# Patient Record
Sex: Female | Born: 1985 | Race: White | Hispanic: No | Marital: Married | Smoking: Never smoker
Health system: Southern US, Community
[De-identification: ages and names within clinical notes are randomized; demographics above are authoritative.]

## PROBLEM LIST (undated history)

## (undated) DIAGNOSIS — R87629 Unspecified abnormal cytological findings in specimens from vagina: Secondary | ICD-10-CM

## (undated) DIAGNOSIS — S52121A Displaced fracture of head of right radius, initial encounter for closed fracture: Secondary | ICD-10-CM

## (undated) DIAGNOSIS — A609 Anogenital herpesviral infection, unspecified: Secondary | ICD-10-CM

## (undated) DIAGNOSIS — Z789 Other specified health status: Secondary | ICD-10-CM

## (undated) HISTORY — DX: Anogenital herpesviral infection, unspecified: A60.9

## (undated) HISTORY — PX: COLPOSCOPY: SHX161

## (undated) HISTORY — DX: Unspecified abnormal cytological findings in specimens from vagina: R87.629

---

## 2014-11-16 ENCOUNTER — Emergency Department (HOSPITAL_COMMUNITY)
Admission: EM | Admit: 2014-11-16 | Discharge: 2014-11-17 | Disposition: A | Discharge status: Home / Self Care | Payer: No Typology Code available for payment source | Attending: Emergency Medicine | Admitting: Emergency Medicine

## 2014-11-16 ENCOUNTER — Encounter (HOSPITAL_COMMUNITY): Payer: Self-pay | Admitting: Emergency Medicine

## 2014-11-16 ENCOUNTER — Emergency Department (HOSPITAL_COMMUNITY): Payer: No Typology Code available for payment source

## 2014-11-16 DIAGNOSIS — S52121A Displaced fracture of head of right radius, initial encounter for closed fracture: Secondary | ICD-10-CM | POA: Diagnosis not present

## 2014-11-16 DIAGNOSIS — Y9389 Activity, other specified: Secondary | ICD-10-CM | POA: Diagnosis not present

## 2014-11-16 DIAGNOSIS — Y998 Other external cause status: Secondary | ICD-10-CM | POA: Diagnosis not present

## 2014-11-16 DIAGNOSIS — S8002XA Contusion of left knee, initial encounter: Secondary | ICD-10-CM | POA: Diagnosis not present

## 2014-11-16 DIAGNOSIS — S59901A Unspecified injury of right elbow, initial encounter: Secondary | ICD-10-CM | POA: Diagnosis present

## 2014-11-16 DIAGNOSIS — Y92410 Unspecified street and highway as the place of occurrence of the external cause: Secondary | ICD-10-CM | POA: Diagnosis not present

## 2014-11-16 DIAGNOSIS — Z880 Allergy status to penicillin: Secondary | ICD-10-CM | POA: Diagnosis not present

## 2014-11-16 DIAGNOSIS — M25531 Pain in right wrist: Secondary | ICD-10-CM

## 2014-11-16 DIAGNOSIS — S6991XA Unspecified injury of right wrist, hand and finger(s), initial encounter: Secondary | ICD-10-CM | POA: Insufficient documentation
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
## 2014-11-17 ENCOUNTER — Emergency Department (HOSPITAL_COMMUNITY): Payer: No Typology Code available for payment source

## 2014-11-17 DIAGNOSIS — S52121A Displaced fracture of head of right radius, initial encounter for closed fracture: Secondary | ICD-10-CM | POA: Diagnosis not present

## 2014-11-17 MED ORDER — OXYCODONE-ACETAMINOPHEN 5-325 MG PO TABS: 1.0000 | ORAL_TABLET | Freq: Four times a day (QID) | ORAL | Status: DC | PRN

## 2014-11-17 MED ORDER — OXYCODONE-ACETAMINOPHEN 5-325 MG PO TABS
1.0000 | ORAL_TABLET | Freq: Once | ORAL | Status: AC
  Administered 2014-11-17: 1 via ORAL
  Filled 2014-11-17: qty 1

## 2014-11-17 NOTE — Anesthesia Preprocedure Evaluation (Deleted)
Anesthesia Evaluation Anesthesia Physical Anesthesia Plan Anesthesia Quick Evaluation  

## 2014-11-17 NOTE — ED Notes (Signed)
Patient left at this time with all belongings. 
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
## 2014-11-18 ENCOUNTER — Ambulatory Visit (HOSPITAL_BASED_OUTPATIENT_CLINIC_OR_DEPARTMENT_OTHER): Payer: BC Managed Care – PPO | Admitting: Anesthesiology

## 2014-11-18 ENCOUNTER — Ambulatory Visit (HOSPITAL_BASED_OUTPATIENT_CLINIC_OR_DEPARTMENT_OTHER)
Admission: RE | Admit: 2014-11-18 | Discharge: 2014-11-18 | Disposition: A | Discharge status: Home / Self Care | Payer: BC Managed Care – PPO | Source: Ambulatory Visit | Attending: Orthopedic Surgery | Admitting: Orthopedic Surgery

## 2014-11-18 ENCOUNTER — Encounter (HOSPITAL_BASED_OUTPATIENT_CLINIC_OR_DEPARTMENT_OTHER)
Admission: RE | Disposition: A | Discharge status: Home / Self Care | Payer: Self-pay | Source: Ambulatory Visit | Attending: Orthopedic Surgery

## 2014-11-18 ENCOUNTER — Other Ambulatory Visit: Payer: Self-pay | Admitting: Physician Assistant

## 2014-11-18 ENCOUNTER — Encounter (HOSPITAL_BASED_OUTPATIENT_CLINIC_OR_DEPARTMENT_OTHER): Payer: Self-pay | Admitting: *Deleted

## 2014-11-18 DIAGNOSIS — S52121A Displaced fracture of head of right radius, initial encounter for closed fracture: Secondary | ICD-10-CM | POA: Diagnosis not present

## 2014-11-18 HISTORY — DX: Displaced fracture of head of right radius, initial encounter for closed fracture: S52.121A

## 2014-11-18 HISTORY — PX: ORIF RADIAL FRACTURE: SHX5113

## 2014-11-18 HISTORY — DX: Other specified health status: Z78.9

## 2014-11-18 SURGERY — OPEN REDUCTION INTERNAL FIXATION (ORIF) RADIAL FRACTURE
Anesthesia: Regional | Site: Elbow | Laterality: Right

## 2014-11-18 MED ORDER — GLYCOPYRROLATE 0.2 MG/ML IJ SOLN: 0.2000 mg | Freq: Once | INTRAMUSCULAR | Status: DC | PRN

## 2014-11-18 MED ORDER — CEFAZOLIN SODIUM-DEXTROSE 2-3 GM-% IV SOLR
2.0000 g | INTRAVENOUS | Status: AC
  Administered 2014-11-18: 2 g via INTRAVENOUS

## 2014-11-18 MED ORDER — ONDANSETRON HCL 4 MG PO TABS: 4.0000 mg | ORAL_TABLET | Freq: Three times a day (TID) | ORAL | Status: DC | PRN

## 2014-11-18 MED ORDER — 0.9 % SODIUM CHLORIDE (POUR BTL) OPTIME
TOPICAL | Status: DC | PRN
  Administered 2014-11-18: 200 mL

## 2014-11-18 MED ORDER — FENTANYL CITRATE (PF) 100 MCG/2ML IJ SOLN
INTRAMUSCULAR | Status: AC
  Filled 2014-11-18: qty 4

## 2014-11-18 MED ORDER — DEXAMETHASONE SODIUM PHOSPHATE 10 MG/ML IJ SOLN
INTRAMUSCULAR | Status: AC
  Filled 2014-11-18: qty 1

## 2014-11-18 MED ORDER — ONDANSETRON HCL 4 MG/2ML IJ SOLN
INTRAMUSCULAR | Status: AC
  Filled 2014-11-18: qty 2

## 2014-11-18 MED ORDER — BUPIVACAINE-EPINEPHRINE (PF) 0.5% -1:200000 IJ SOLN
INTRAMUSCULAR | Status: DC | PRN
  Administered 2014-11-18: 30 mL via PERINEURAL

## 2014-11-18 MED ORDER — PROMETHAZINE HCL 25 MG/ML IJ SOLN
6.2500 mg | INTRAMUSCULAR | Status: DC | PRN
Start: 2014-11-18 — End: 2014-11-18

## 2014-11-18 MED ORDER — FENTANYL CITRATE (PF) 100 MCG/2ML IJ SOLN
50.0000 ug | INTRAMUSCULAR | Status: AC | PRN
  Administered 2014-11-18: 25 ug via INTRAVENOUS
  Administered 2014-11-18: 100 ug via INTRAVENOUS
  Administered 2014-11-18: 25 ug via INTRAVENOUS

## 2014-11-18 MED ORDER — MIDAZOLAM HCL 2 MG/2ML IJ SOLN
INTRAMUSCULAR | Status: AC
Start: 2014-11-18 — End: 2014-11-18
  Filled 2014-11-18: qty 4

## 2014-11-18 MED ORDER — OXYCODONE-ACETAMINOPHEN 10-325 MG PO TABS: 1.0000 | ORAL_TABLET | Freq: Four times a day (QID) | ORAL | Status: DC | PRN

## 2014-11-18 MED ORDER — BACLOFEN 10 MG PO TABS: 10.0000 mg | ORAL_TABLET | Freq: Three times a day (TID) | ORAL | Status: DC

## 2014-11-18 MED ORDER — FENTANYL CITRATE (PF) 100 MCG/2ML IJ SOLN
INTRAMUSCULAR | Status: AC
  Filled 2014-11-18: qty 2

## 2014-11-18 MED ORDER — MEPERIDINE HCL 25 MG/ML IJ SOLN: 6.2500 mg | INTRAMUSCULAR | Status: DC | PRN

## 2014-11-18 MED ORDER — DEXAMETHASONE SODIUM PHOSPHATE 10 MG/ML IJ SOLN
INTRAMUSCULAR | Status: DC | PRN
  Administered 2014-11-18: 10 mg via INTRAVENOUS

## 2014-11-18 MED ORDER — SCOPOLAMINE 1 MG/3DAYS TD PT72: 1.0000 | MEDICATED_PATCH | Freq: Once | TRANSDERMAL | Status: DC | PRN

## 2014-11-18 MED ORDER — LACTATED RINGERS IV SOLN
INTRAVENOUS | Status: DC
  Administered 2014-11-18 (×2): via INTRAVENOUS

## 2014-11-18 MED ORDER — PROPOFOL 10 MG/ML IV BOLUS: INTRAVENOUS | Status: DC | PRN

## 2014-11-18 MED ORDER — HYDROMORPHONE HCL 1 MG/ML IJ SOLN: 0.2500 mg | INTRAMUSCULAR | Status: DC | PRN

## 2014-11-18 MED ORDER — MIDAZOLAM HCL 2 MG/2ML IJ SOLN
INTRAMUSCULAR | Status: AC
  Filled 2014-11-18: qty 2

## 2014-11-18 MED ORDER — ONDANSETRON HCL 4 MG/2ML IJ SOLN
INTRAMUSCULAR | Status: DC | PRN
  Administered 2014-11-18: 4 mg via INTRAVENOUS

## 2014-11-18 MED ORDER — LIDOCAINE HCL (CARDIAC) 20 MG/ML IV SOLN
INTRAVENOUS | Status: DC | PRN
  Administered 2014-11-18: 50 mg via INTRAVENOUS

## 2014-11-18 MED ORDER — MIDAZOLAM HCL 2 MG/2ML IJ SOLN
1.0000 mg | INTRAMUSCULAR | Status: DC | PRN
  Administered 2014-11-18 (×2): 2 mg via INTRAVENOUS

## 2014-11-18 MED ORDER — PROPOFOL 500 MG/50ML IV EMUL
INTRAVENOUS | Status: DC | PRN
  Administered 2014-11-18: 75 ug/kg/min via INTRAVENOUS

## 2014-11-18 MED ORDER — CEFAZOLIN SODIUM-DEXTROSE 2-3 GM-% IV SOLR
INTRAVENOUS | Status: AC
  Filled 2014-11-18: qty 50

## 2014-11-18 MED ORDER — SENNA-DOCUSATE SODIUM 8.6-50 MG PO TABS: 2.0000 | ORAL_TABLET | Freq: Every day | ORAL | Status: DC

## 2014-11-18 SURGICAL SUPPLY — 61 items
1.1 KWIRE ×3 IMPLANT
2.0 CANNULATED DRILL BIT ×3 IMPLANT
2.4 x 16mm Headless screw ×3 IMPLANT
BANDAGE ELASTIC 3 VELCRO ST LF (GAUZE/BANDAGES/DRESSINGS) IMPLANT
BANDAGE ELASTIC 4 VELCRO ST LF (GAUZE/BANDAGES/DRESSINGS) IMPLANT
BLADE MINI RND TIP GREEN BEAV (BLADE) IMPLANT
BLADE SURG 15 STRL LF DISP TIS (BLADE) ×2 IMPLANT
BLADE SURG 15 STRL SS (BLADE) ×4
BNDG COHESIVE 4X5 TAN STRL (GAUZE/BANDAGES/DRESSINGS) ×3 IMPLANT
BNDG ESMARK 4X9 LF (GAUZE/BANDAGES/DRESSINGS) ×3 IMPLANT
CLOSURE STERI-STRIP 1/2X4 (GAUZE/BANDAGES/DRESSINGS)
CLSR STERI-STRIP ANTIMIC 1/2X4 (GAUZE/BANDAGES/DRESSINGS) IMPLANT
COVER BACK TABLE 60X90IN (DRAPES) ×3 IMPLANT
CUFF TOURNIQUET SINGLE 18IN (TOURNIQUET CUFF) ×3 IMPLANT
DECANTER SPIKE VIAL GLASS SM (MISCELLANEOUS) IMPLANT
DRAPE EXTREMITY T 121X128X90 (DRAPE) ×3 IMPLANT
DRAPE INCISE IOBAN 66X45 STRL (DRAPES) IMPLANT
DRAPE OEC MINIVIEW 54X84 (DRAPES) ×3 IMPLANT
DRAPE SURG 17X23 STRL (DRAPES) ×3 IMPLANT
DRAPE U 20/CS (DRAPES) ×3 IMPLANT
DRSG TEGADERM 4X4.75 (GAUZE/BANDAGES/DRESSINGS) ×3 IMPLANT
DURAPREP 26ML APPLICATOR (WOUND CARE) ×3 IMPLANT
ELECT REM PT RETURN 9FT ADLT (ELECTROSURGICAL)
ELECTRODE REM PT RTRN 9FT ADLT (ELECTROSURGICAL) IMPLANT
GAUZE SPONGE 4X4 12PLY STRL (GAUZE/BANDAGES/DRESSINGS) IMPLANT
GLOVE BIO SURGEON STRL SZ8 (GLOVE) ×3 IMPLANT
GLOVE BIOGEL PI IND STRL 8 (GLOVE) ×2 IMPLANT
GLOVE BIOGEL PI INDICATOR 8 (GLOVE) ×4
GLOVE ORTHO TXT STRL SZ7.5 (GLOVE) ×3 IMPLANT
GOWN STRL REUS W/ TWL LRG LVL3 (GOWN DISPOSABLE) ×1 IMPLANT
GOWN STRL REUS W/ TWL XL LVL3 (GOWN DISPOSABLE) ×2 IMPLANT
GOWN STRL REUS W/TWL LRG LVL3 (GOWN DISPOSABLE) ×2
GOWN STRL REUS W/TWL XL LVL3 (GOWN DISPOSABLE) ×4
NEEDLE HYPO 25X1 1.5 SAFETY (NEEDLE) IMPLANT
NS IRRIG 1000ML POUR BTL (IV SOLUTION) ×3 IMPLANT
PACK BASIN DAY SURGERY FS (CUSTOM PROCEDURE TRAY) ×3 IMPLANT
PAD CAST 3X4 CTTN HI CHSV (CAST SUPPLIES) IMPLANT
PAD CAST 4YDX4 CTTN HI CHSV (CAST SUPPLIES) IMPLANT
PADDING CAST ABS 4INX4YD NS (CAST SUPPLIES)
PADDING CAST ABS COTTON 4X4 ST (CAST SUPPLIES) IMPLANT
PADDING CAST COTTON 3X4 STRL (CAST SUPPLIES)
PADDING CAST COTTON 4X4 STRL (CAST SUPPLIES)
PENCIL BUTTON HOLSTER BLD 10FT (ELECTRODE) IMPLANT
SLEEVE SCD COMPRESS KNEE MED (MISCELLANEOUS) ×3 IMPLANT
SPLINT PLASTER CAST XFAST 3X15 (CAST SUPPLIES) IMPLANT
SPLINT PLASTER XTRA FASTSET 3X (CAST SUPPLIES)
SPONGE GAUZE 4X4 12PLY STER LF (GAUZE/BANDAGES/DRESSINGS) ×3 IMPLANT
STOCKINETTE 4X48 STRL (DRAPES) IMPLANT
SUCTION FRAZIER TIP 10 FR DISP (SUCTIONS) IMPLANT
SUT ETHILON 3 0 PS 1 (SUTURE) IMPLANT
SUT ETHILON 4 0 PS 2 18 (SUTURE) IMPLANT
SUT MNCRL AB 4-0 PS2 18 (SUTURE) IMPLANT
SUT VIC AB 0 CT1 27 (SUTURE)
SUT VIC AB 0 CT1 27XBRD ANBCTR (SUTURE) IMPLANT
SUT VICRYL 3-0 CR8 SH (SUTURE) ×3 IMPLANT
SYR BULB 3OZ (MISCELLANEOUS) ×3 IMPLANT
SYR CONTROL 10ML LL (SYRINGE) IMPLANT
TOWEL OR 17X24 6PK STRL BLUE (TOWEL DISPOSABLE) ×6 IMPLANT
TUBE CONNECTING 20'X1/4 (TUBING) ×1
TUBE CONNECTING 20X1/4 (TUBING) ×2 IMPLANT
UNDERPAD 30X30 (UNDERPADS AND DIAPERS) ×3 IMPLANT

## 2014-11-18 NOTE — Anesthesia Preprocedure Evaluation (Addendum)
Anesthesia Evaluation  Patient identified by MRN, date of birth, ID band Patient awake    Reviewed: Allergy & Precautions, NPO status , Patient's Chart, lab work & pertinent test results  Airway Mallampati: II  TM Distance: >3 FB Neck ROM: Full    Dental no notable dental hx.    Pulmonary neg pulmonary ROS,    Pulmonary exam normal breath sounds clear to auscultation       Cardiovascular negative cardio ROS Normal cardiovascular exam Rhythm:Regular Rate:Normal     Neuro/Psych negative neurological ROS  negative psych ROS   GI/Hepatic negative GI ROS, Neg liver ROS,   Endo/Other  negative endocrine ROS  Renal/GU negative Renal ROS     Musculoskeletal negative musculoskeletal ROS (+)   Abdominal   Peds  Hematology negative hematology ROS (+)   Anesthesia Other Findings   Reproductive/Obstetrics negative OB ROS                             Anesthesia Physical Anesthesia Plan  ASA: I  Anesthesia Plan: Regional   Post-op Pain Management:    Induction: Intravenous  Airway Management Planned:   Additional Equipment:   Intra-op Plan:   Post-operative Plan:   Informed Consent: I have reviewed the patients History and Physical, chart, labs and discussed the procedure including the risks, benefits and alternatives for the proposed anesthesia with the patient or authorized representative who has indicated his/her understanding and acceptance.   Dental advisory given  Plan Discussed with: CRNA  Anesthesia Plan Comments:         Anesthesia Quick Evaluation  

## 2014-11-18 NOTE — Progress Notes (Signed)
Assisted Dr. Renold Don with right, ultrasound guided, axillary block. Side rails up, monitors on throughout procedure. See vital signs in flow sheet.

## 2014-11-18 NOTE — Anesthesia Procedure Notes (Addendum)
Procedure Name: MAC Date/Time: 11/18/2014 2:17 PM Performed by: Caren Macadam Pre-anesthesia Checklist: Patient identified, Timeout performed, Emergency Drugs available, Suction available and Patient being monitored Oxygen Delivery Method: Simple face mask Preoxygenation: Pre-oxygenation with 100% oxygen Intubation Type: IV induction Placement Confirmation: positive ETCO2   Anesthesia Regional Block:  Axillary brachial plexus block  Pre-Anesthetic Checklist: ,, timeout performed, Correct Patient, Correct Site, Correct Laterality, Correct Procedure, Correct Position, site marked, Risks and benefits discussed, Surgical consent,  Pre-op evaluation,  Post-op pain management  Laterality: Right  Prep: chloraprep       Needles:  Injection technique: Single-shot  Needle Type: Stimulator Needle - 40     Needle Length: 4cm 4 cm Needle Gauge: 22 and 22 G    Additional Needles:  Procedures: ultrasound guided (picture in chart) Axillary brachial plexus block Narrative:  Injection made incrementally with aspirations every 5 mL.  Performed by: Personally  Anesthesiologist: Lewie Loron  Additional Notes: BP cuff, EKG monitors applied. Sedation begun. Nerve location verified with U/S. Anesthetic injected incrementally, slowly , and after neg aspirations under direct u/s guidance. Good perineural spread. Tolerated well.

## 2014-11-18 NOTE — Anesthesia Postprocedure Evaluation (Signed)
Anesthesia Post Note  Patient: Betty Pratt  Procedure(s) Performed: Procedure(s) (LRB): OPEN REDUCTION INTERNAL FIXATION (ORIF) RIGHT RADIAL HEAD FRACTURE (Right)  Anesthesia type: MAC + ax block  Patient location: PACU  Post pain: Pain level controlled  Post assessment: Post-op Vital signs reviewed  Last Vitals: BP 114/75 mmHg  Pulse 69  Temp(Src) 36.8 C (Oral)  Resp 12  Ht  (1.676 m)  Wt 118 lb 2 oz (53.581 kg)  BMI 19.07 kg/m2  SpO2 98%  LMP 11/18/2014  Post vital signs: Reviewed  Level of consciousness: awake  Complications: No apparent anesthesia complications
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
## 2014-11-18 NOTE — Transfer of Care (Signed)
Immediate Anesthesia Transfer of Care Note  Patient: Betty Pratt  Procedure(s) Performed: Procedure(s): OPEN REDUCTION INTERNAL FIXATION (ORIF) RIGHT RADIAL HEAD FRACTURE (Right)  Patient Location: PACU  Anesthesia Type:MAC and MAC combined with regional for post-op pain  Level of Consciousness: awake, alert  and oriented  Airway & Oxygen Therapy: Patient Spontanous Breathing  Post-op Assessment: Report given to RN and Post -op Vital signs reviewed and stable  Post vital signs: Reviewed and stable  Last Vitals:  Filed Vitals:   11/18/14 1230  BP:   Pulse: 79  Temp:   Resp: 13    Complications: No apparent anesthesia complications

## 2014-11-18 NOTE — Discharge Instructions (Signed)
Diet: As you were doing prior to hospitalization   Shower:  May shower but keep the wounds dry, use an occlusive plastic wrap, NO SOAKING IN TUB.  If the bandage gets wet, change with a clean dry gauze.   Dressing:  You may change your dressing 3-5 days after surgery.  If you had hand or foot surgery, we will plan to remove your stitches in about 2 weeks in the office.  For all other surgeries, there are sticky tapes (steri-strips) on your wounds and all the stitches are absorbable.  Leave the steri-strips in place when changing your dressings, they will peel off with time, usually 2-3 weeks.  Activity:  Increase activity slowly as tolerated, but follow the weight bearing instructions below.  The rules on driving is that you can not be taking narcotics while you drive, and you must feel in control of the vehicle.    Weight Bearing:   No lifting with right arm..    To prevent constipation: you may use a stool softener such as -  Colace (over the counter) 100 mg by mouth twice a day  Drink plenty of fluids (prune juice may be helpful) and high fiber foods Miralax (over the counter) for constipation as needed.    Itching:  If you experience itching with your medications, try taking only a single pain pill, or even half a pain pill at a time.  You may take up to 10 pain pills per day, and you can also use benadryl over the counter for itching or also to help with sleep.   Precautions:  If you experience chest pain or shortness of breath - call 911 immediately for transfer to the hospital emergency department!!  If you develop a fever greater that 101 F, purulent drainage from wound, increased redness or drainage from wound, or calf pain -- Call the office at [phone]                                                Follow- Up Appointment:  Please call for an appointment to be seen in 2 weeks [place] - [phone]  Regional Anesthesia Blocks  1. Numbness or the inability to move the  "blocked" extremity may last from 3-48 hours after placement. The length of time depends on the medication injected and your individual response to the medication. If the numbness is not going away after 48 hours, call your surgeon.  2. The extremity that is blocked will need to be protected until the numbness is gone and the  Strength has returned. Because you cannot feel it, you will need to take extra care to avoid injury. Because it may be weak, you may have difficulty moving it or using it. You may not know what position it is in without looking at it while the block is in effect.  3. For blocks in the legs and feet, returning to weight bearing and walking needs to be done carefully. You will need to wait until the numbness is entirely gone and the strength has returned. You should be able to move your leg and foot normally before you try and bear weight or walk. You will need someone to be with you when you first try to ensure you do not fall and possibly risk injury.  4. Bruising and tenderness at the needle site are common side  effects and will resolve in a few days.  5. Persistent numbness or new problems with movement should be communicated to the surgeon or the [facility] [phone] [facility] [phone]).  Post Anesthesia Home Care Instructions  Activity: Get plenty of rest for the remainder of the day. A responsible adult should stay with you for 24 hours following the procedure.  For the next 24 hours, DO NOT: -Drive a car -Advertising copywriter -Drink alcoholic beverages -Take any medication unless instructed by your physician -Make any legal decisions or sign important papers.  Meals: Start with liquid foods such as gelatin or soup. Progress to regular foods as tolerated. Avoid greasy, spicy, heavy foods. If nausea and/or vomiting occur, drink only clear liquids until the nausea and/or vomiting subsides. Call your physician if vomiting  continues.  Special Instructions/Symptoms: Your throat may feel dry or sore from the anesthesia or the breathing tube placed in your throat during surgery. If this causes discomfort, gargle with warm salt water. The discomfort should disappear within 24 hours.  If you had a scopolamine patch placed behind your ear for the management of post- operative nausea and/or vomiting:  1. The medication in the patch is effective for 72 hours, after which it should be removed.  Wrap patch in a tissue and discard in the trash. Wash hands thoroughly with soap and water. 2. You may remove the patch earlier than 72 hours if you experience unpleasant side effects which may include dry mouth, dizziness or visual disturbances. 3. Avoid touching the patch. Wash your hands with soap and water after contact with the patch.

## 2014-11-19 ENCOUNTER — Encounter (HOSPITAL_BASED_OUTPATIENT_CLINIC_OR_DEPARTMENT_OTHER): Payer: Self-pay | Admitting: Orthopedic Surgery

## 2015-11-21 LAB — OB RESULTS CONSOLE RUBELLA ANTIBODY, IGM: Rubella: IMMUNE

## 2015-11-21 LAB — OB RESULTS CONSOLE GC/CHLAMYDIA
CHLAMYDIA, DNA PROBE: NEGATIVE
GC PROBE AMP, GENITAL: NEGATIVE

## 2015-11-21 LAB — OB RESULTS CONSOLE ABO/RH: RH TYPE: POSITIVE

## 2015-11-21 LAB — OB RESULTS CONSOLE HIV ANTIBODY (ROUTINE TESTING): HIV: NONREACTIVE

## 2015-11-21 LAB — OB RESULTS CONSOLE RPR: RPR: NONREACTIVE

## 2015-11-21 LAB — OB RESULTS CONSOLE HEPATITIS B SURFACE ANTIGEN: Hepatitis B Surface Ag: NEGATIVE

## 2016-02-20 NOTE — L&D Delivery Note (Signed)
Delivery Note At 1:16 PM a viable female was delivered via Vaginal, Spontaneous Delivery (Presentation:OA ;  ).  APGAR: , ; weight  .   Placenta statusspont>>intact: , .  Cord:  with the following complications: .  Cord pH: not sent  Anesthesia:  epid Episiotomy: None Lacerations: 2nd degree + bilat periurethral Suture Repair: 3.0 vicryl rapide Est. Blood Loss (mL): 300  Mom to postpartum.  Baby to Couplet care / Skin to Skin.  Meriel Pica 06/16/2016, 1:41 PM
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
## 2016-05-21 LAB — OB RESULTS CONSOLE GC/CHLAMYDIA
Chlamydia: NEGATIVE
GC PROBE AMP, GENITAL: NEGATIVE

## 2016-05-21 LAB — OB RESULTS CONSOLE GBS: GBS: NEGATIVE

## 2016-06-16 ENCOUNTER — Encounter (HOSPITAL_COMMUNITY): Payer: Self-pay

## 2016-06-16 ENCOUNTER — Inpatient Hospital Stay (HOSPITAL_COMMUNITY)
Admission: AD | Admit: 2016-06-16 | Discharge: 2016-06-17 | DRG: 775 | Disposition: A | Discharge status: Home / Self Care | Payer: BLUE CROSS/BLUE SHIELD | Source: Ambulatory Visit | Attending: Obstetrics and Gynecology | Admitting: Obstetrics and Gynecology

## 2016-06-16 ENCOUNTER — Inpatient Hospital Stay (HOSPITAL_COMMUNITY): Payer: BLUE CROSS/BLUE SHIELD | Admitting: Anesthesiology

## 2016-06-16 DIAGNOSIS — Z3A37 37 weeks gestation of pregnancy: Secondary | ICD-10-CM

## 2016-06-16 DIAGNOSIS — Z3493 Encounter for supervision of normal pregnancy, unspecified, third trimester: Secondary | ICD-10-CM | POA: Diagnosis present

## 2016-06-16 LAB — CBC
HCT: 35.1 % — ABNORMAL LOW (ref 36.0–46.0)
HEMOGLOBIN: 11.6 g/dL — AB (ref 12.0–15.0)
MCH: 26.4 pg (ref 26.0–34.0)
MCHC: 33 g/dL (ref 30.0–36.0)
MCV: 80 fL (ref 78.0–100.0)
PLATELETS: 239 10*3/uL (ref 150–400)
RBC: 4.39 MIL/uL (ref 3.87–5.11)
RDW: 13.7 % (ref 11.5–15.5)
WBC: 13.7 10*3/uL — AB (ref 4.0–10.5)

## 2016-06-16 LAB — ABO/RH: ABO/RH(D): A POS

## 2016-06-16 LAB — TYPE AND SCREEN
ABO/RH(D): A POS
Antibody Screen: NEGATIVE

## 2016-06-16 MED ORDER — LACTATED RINGERS IV SOLN: 500.0000 mL | INTRAVENOUS | Status: DC | PRN

## 2016-06-16 MED ORDER — DIPHENHYDRAMINE HCL 50 MG/ML IJ SOLN: 12.5000 mg | INTRAMUSCULAR | Status: DC | PRN

## 2016-06-16 MED ORDER — COCONUT OIL OIL: 1.0000 "application " | TOPICAL_OIL | Status: DC | PRN

## 2016-06-16 MED ORDER — FLEET ENEMA 7-19 GM/118ML RE ENEM: 1.0000 | ENEMA | Freq: Every day | RECTAL | Status: DC | PRN

## 2016-06-16 MED ORDER — WITCH HAZEL-GLYCERIN EX PADS: 1.0000 "application " | MEDICATED_PAD | CUTANEOUS | Status: DC | PRN

## 2016-06-16 MED ORDER — OXYCODONE-ACETAMINOPHEN 5-325 MG PO TABS: 2.0000 | ORAL_TABLET | ORAL | Status: DC | PRN

## 2016-06-16 MED ORDER — SIMETHICONE 80 MG PO CHEW: 80.0000 mg | CHEWABLE_TABLET | ORAL | Status: DC | PRN

## 2016-06-16 MED ORDER — LACTATED RINGERS IV SOLN
INTRAVENOUS | Status: DC
  Administered 2016-06-16: 09:00:00 via INTRAVENOUS

## 2016-06-16 MED ORDER — IBUPROFEN 800 MG PO TABS
800.0000 mg | ORAL_TABLET | Freq: Three times a day (TID) | ORAL | Status: DC | PRN
  Administered 2016-06-16 – 2016-06-17 (×4): 800 mg via ORAL
  Filled 2016-06-16 (×4): qty 1

## 2016-06-16 MED ORDER — BISACODYL 10 MG RE SUPP: 10.0000 mg | Freq: Every day | RECTAL | Status: DC | PRN

## 2016-06-16 MED ORDER — ONDANSETRON HCL 4 MG/2ML IJ SOLN: 4.0000 mg | Freq: Four times a day (QID) | INTRAMUSCULAR | Status: DC | PRN

## 2016-06-16 MED ORDER — SENNOSIDES-DOCUSATE SODIUM 8.6-50 MG PO TABS
2.0000 | ORAL_TABLET | ORAL | Status: DC
  Administered 2016-06-16: 2 via ORAL
  Filled 2016-06-16: qty 2

## 2016-06-16 MED ORDER — EPHEDRINE 5 MG/ML INJ
10.0000 mg | INTRAVENOUS | Status: DC | PRN
  Filled 2016-06-16: qty 2

## 2016-06-16 MED ORDER — ZOLPIDEM TARTRATE 5 MG PO TABS: 5.0000 mg | ORAL_TABLET | Freq: Every evening | ORAL | Status: DC | PRN

## 2016-06-16 MED ORDER — OXYTOCIN 40 UNITS IN LACTATED RINGERS INFUSION - SIMPLE MED
2.5000 [IU]/h | INTRAVENOUS | Status: DC
  Filled 2016-06-16: qty 1000

## 2016-06-16 MED ORDER — PHENYLEPHRINE 40 MCG/ML (10ML) SYRINGE FOR IV PUSH (FOR BLOOD PRESSURE SUPPORT)
80.0000 ug | PREFILLED_SYRINGE | INTRAVENOUS | Status: DC | PRN
  Filled 2016-06-16: qty 5

## 2016-06-16 MED ORDER — FENTANYL 2.5 MCG/ML BUPIVACAINE 1/10 % EPIDURAL INFUSION (WH - ANES)
14.0000 mL/h | INTRAMUSCULAR | Status: DC | PRN
  Administered 2016-06-16: 14 mL/h via EPIDURAL
  Filled 2016-06-16: qty 100

## 2016-06-16 MED ORDER — OXYCODONE-ACETAMINOPHEN 5-325 MG PO TABS
1.0000 | ORAL_TABLET | ORAL | Status: DC | PRN
  Administered 2016-06-17: 1 via ORAL
  Filled 2016-06-16: qty 1

## 2016-06-16 MED ORDER — LACTATED RINGERS IV SOLN: 500.0000 mL | Freq: Once | INTRAVENOUS | Status: DC

## 2016-06-16 MED ORDER — ACETAMINOPHEN 325 MG PO TABS: 650.0000 mg | ORAL_TABLET | ORAL | Status: DC | PRN

## 2016-06-16 MED ORDER — PHENYLEPHRINE 40 MCG/ML (10ML) SYRINGE FOR IV PUSH (FOR BLOOD PRESSURE SUPPORT)
80.0000 ug | PREFILLED_SYRINGE | INTRAVENOUS | Status: DC | PRN
  Filled 2016-06-16: qty 10
  Filled 2016-06-16: qty 5

## 2016-06-16 MED ORDER — ONDANSETRON HCL 4 MG PO TABS: 4.0000 mg | ORAL_TABLET | ORAL | Status: DC | PRN

## 2016-06-16 MED ORDER — BUTORPHANOL TARTRATE 1 MG/ML IJ SOLN: 1.0000 mg | INTRAMUSCULAR | Status: DC | PRN

## 2016-06-16 MED ORDER — MEASLES, MUMPS & RUBELLA VAC ~~LOC~~ INJ: 0.5000 mL | INJECTION | Freq: Once | SUBCUTANEOUS | Status: DC

## 2016-06-16 MED ORDER — ONDANSETRON HCL 4 MG/2ML IJ SOLN: 4.0000 mg | INTRAMUSCULAR | Status: DC | PRN

## 2016-06-16 MED ORDER — BUTORPHANOL TARTRATE 1 MG/ML IJ SOLN
1.0000 mg | INTRAMUSCULAR | Status: DC
  Administered 2016-06-16: 1 mg via INTRAVENOUS
  Filled 2016-06-16: qty 1

## 2016-06-16 MED ORDER — TERBUTALINE SULFATE 1 MG/ML IJ SOLN
0.2500 mg | Freq: Once | INTRAMUSCULAR | Status: DC | PRN
  Filled 2016-06-16: qty 1

## 2016-06-16 MED ORDER — OXYTOCIN BOLUS FROM INFUSION
500.0000 mL | Freq: Once | INTRAVENOUS | Status: AC
  Administered 2016-06-16: 500 mL via INTRAVENOUS

## 2016-06-16 MED ORDER — DIBUCAINE 1 % RE OINT: 1.0000 "application " | TOPICAL_OINTMENT | RECTAL | Status: DC | PRN

## 2016-06-16 MED ORDER — OXYCODONE-ACETAMINOPHEN 5-325 MG PO TABS: 1.0000 | ORAL_TABLET | ORAL | Status: DC | PRN

## 2016-06-16 MED ORDER — TETANUS-DIPHTH-ACELL PERTUSSIS 5-2.5-18.5 LF-MCG/0.5 IM SUSP: 0.5000 mL | Freq: Once | INTRAMUSCULAR | Status: DC

## 2016-06-16 MED ORDER — DIPHENHYDRAMINE HCL 25 MG PO CAPS: 25.0000 mg | ORAL_CAPSULE | Freq: Four times a day (QID) | ORAL | Status: DC | PRN

## 2016-06-16 MED ORDER — BENZOCAINE-MENTHOL 20-0.5 % EX AERO
1.0000 "application " | INHALATION_SPRAY | CUTANEOUS | Status: DC | PRN
  Administered 2016-06-16: 1 via TOPICAL
  Filled 2016-06-16: qty 56

## 2016-06-16 MED ORDER — SOD CITRATE-CITRIC ACID 500-334 MG/5ML PO SOLN: 30.0000 mL | ORAL | Status: DC | PRN

## 2016-06-16 MED ORDER — OXYTOCIN 40 UNITS IN LACTATED RINGERS INFUSION - SIMPLE MED
1.0000 m[IU]/min | INTRAVENOUS | Status: DC
  Administered 2016-06-16: 2 m[IU]/min via INTRAVENOUS

## 2016-06-16 MED ORDER — LIDOCAINE HCL (PF) 1 % IJ SOLN
30.0000 mL | INTRAMUSCULAR | Status: DC | PRN
  Administered 2016-06-16: 30 mL via SUBCUTANEOUS
  Filled 2016-06-16: qty 30

## 2016-06-16 MED ORDER — PRENATAL MULTIVITAMIN CH
1.0000 | ORAL_TABLET | Freq: Every day | ORAL | Status: DC
  Administered 2016-06-17: 1 via ORAL
  Filled 2016-06-16: qty 1

## 2016-06-16 MED ORDER — ACETAMINOPHEN 325 MG PO TABS
650.0000 mg | ORAL_TABLET | ORAL | Status: DC | PRN
  Administered 2016-06-16 – 2016-06-17 (×2): 650 mg via ORAL
  Filled 2016-06-16: qty 2

## 2016-06-16 MED ORDER — LIDOCAINE HCL (PF) 1 % IJ SOLN
INTRAMUSCULAR | Status: DC | PRN
  Administered 2016-06-16: 4 mL
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
## 2016-06-16 NOTE — Anesthesia Preprocedure Evaluation (Signed)
Anesthesia Evaluation  Patient identified by MRN, date of birth, ID band Patient awake    Reviewed: Allergy & Precautions, Patient's Chart, lab work & pertinent test results  Airway Mallampati: I  TM Distance: >3 FB Neck ROM: Full    Dental  (+) Teeth Intact, Dental Advisory Given   Pulmonary neg pulmonary ROS,    breath sounds clear to auscultation       Cardiovascular negative cardio ROS   Rhythm:Regular Rate:Normal     Neuro/Psych negative neurological ROS  negative psych ROS   GI/Hepatic negative GI ROS, Neg liver ROS,   Endo/Other  negative endocrine ROS  Renal/GU negative Renal ROS  negative genitourinary   Musculoskeletal negative musculoskeletal ROS (+)   Abdominal   Peds negative pediatric ROS (+)  Hematology negative hematology ROS (+)   Anesthesia Other Findings   Reproductive/Obstetrics (+) Pregnancy                             Lab Results  Component Value Date   WBC 13.7 (H) 06/16/2016   HGB 11.6 (L) 06/16/2016   HCT 35.1 (L) 06/16/2016   MCV 80.0 06/16/2016   PLT 239 06/16/2016     Anesthesia Physical Anesthesia Plan  ASA: II  Anesthesia Plan: Epidural   Post-op Pain Management:    Induction:   Airway Management Planned:   Additional Equipment:   Intra-op Plan:   Post-operative Plan:   Informed Consent: I have reviewed the patients History and Physical, chart, labs and discussed the procedure including the risks, benefits and alternatives for the proposed anesthesia with the patient or authorized representative who has indicated his/her understanding and acceptance.     Plan Discussed with:   Anesthesia Plan Comments:         Anesthesia Quick Evaluation

## 2016-06-16 NOTE — Anesthesia Procedure Notes (Signed)
Epidural Patient location during procedure: OB Start time: 06/16/2016 10:13 AM End time: 06/16/2016 10:19 AM  Staffing Anesthesiologist: Shona Simpson D Performed: anesthesiologist   Preanesthetic Checklist Completed: patient identified, site marked, surgical consent, pre-op evaluation, timeout performed, IV checked, risks and benefits discussed and monitors and equipment checked  Epidural Patient position: sitting Prep: ChloraPrep Patient monitoring: heart rate, continuous pulse ox and blood pressure Approach: midline Location: L3-L4 Injection technique: LOR saline  Needle:  Needle type: Tuohy  Needle gauge: 17 G Needle length: 9 cm Catheter type: closed end flexible Catheter size: 20 Guage Test dose: negative and 1.5% lidocaine  Assessment Events: blood not aspirated, injection not painful, no injection resistance and no paresthesia  Additional Notes LOR @ 5  Patient identified. Risks/Benefits/Options discussed with patient including but not limited to bleeding, infection, nerve damage, paralysis, failed block, incomplete pain control, headache, blood pressure changes, nausea, vomiting, reactions to medications, itching and postpartum back pain. Confirmed with bedside nurse the patient's most recent platelet count. Confirmed with patient that they are not currently taking any anticoagulation, have any bleeding history or any family history of bleeding disorders. Patient expressed understanding and wished to proceed. All questions were answered. Sterile technique was used throughout the entire procedure. Please see nursing notes for vital signs. Test dose was given through epidural catheter and negative prior to continuing to dose epidural or start infusion. Warning signs of high block given to the patient including shortness of breath, tingling/numbness in hands, complete motor block, or any concerning symptoms with instructions to call for help. Patient was given instructions on  fall risk and not to get out of bed. All questions and concerns addressed with instructions to call with any issues or inadequate analgesia.    Reason for block:procedure for pain

## 2016-06-16 NOTE — Anesthesia Pain Management Evaluation Note (Signed)
  CRNA Pain Management Visit Note  Patient: Betty Pratt, 31 y.o., female  "Hello I am a member of the anesthesia team at [facility]. We have an anesthesia team available at all times to provide care throughout the hospital, including epidural management and anesthesia for C-section. I don't know your plan for the delivery whether it a natural birth, water birth, IV sedation, nitrous supplementation, doula or epidural, but we want to meet your pain goals."   1.Was your pain managed to your expectations on prior hospitalizations?   No prior hospitalizations  2.What is your expectation for pain management during this hospitalization?     Epidural  3.How can we help you reach that goal? Epidural recently placed.  Record the patient's initial score and the patient's pain goal.   Pain: 8 prior to epidural.  Per patient, pain is gradually decreasing.  Pain Goal: 5 [facility] wants you to be able to say your pain was always managed very well.  Caliyah Sieh L 06/16/2016

## 2016-06-16 NOTE — MAU Note (Signed)
Urine in lab 

## 2016-06-16 NOTE — Progress Notes (Signed)
Dr Marcelle Overlie notified of pt's VE, FHR pattern, contraction pattern, orders received to admit pt

## 2016-06-16 NOTE — MAU Note (Signed)
Pt presents to MAU with complaints of contractions with bloody show that started around 1am.

## 2016-06-17 LAB — CBC
HCT: 29.5 % — ABNORMAL LOW (ref 36.0–46.0)
Hemoglobin: 9.9 g/dL — ABNORMAL LOW (ref 12.0–15.0)
MCH: 27.4 pg (ref 26.0–34.0)
MCHC: 33.6 g/dL (ref 30.0–36.0)
MCV: 81.7 fL (ref 78.0–100.0)
PLATELETS: 198 10*3/uL (ref 150–400)
RBC: 3.61 MIL/uL — AB (ref 3.87–5.11)
RDW: 13.9 % (ref 11.5–15.5)
WBC: 10.7 10*3/uL — AB (ref 4.0–10.5)

## 2016-06-17 LAB — RPR: RPR Ser Ql: NONREACTIVE

## 2016-06-17 MED ORDER — IBUPROFEN 800 MG PO TABS: 800.0000 mg | ORAL_TABLET | Freq: Three times a day (TID) | ORAL | 1 refills | Status: AC | PRN

## 2016-06-17 MED ORDER — OXYCODONE-ACETAMINOPHEN 5-325 MG PO TABS: 1.0000 | ORAL_TABLET | Freq: Four times a day (QID) | ORAL | 0 refills | Status: DC | PRN

## 2016-06-17 NOTE — Anesthesia Postprocedure Evaluation (Signed)
Anesthesia Post Note  Patient: Betty Pratt  Procedure(s) Performed: * No procedures listed *  Patient location during evaluation: Mother Baby Anesthesia Type: Epidural Level of consciousness: awake Pain management: satisfactory to patient Vital Signs Assessment: post-procedure vital signs reviewed and stable Respiratory status: spontaneous breathing Cardiovascular status: stable Anesthetic complications: no        Last Vitals:  Vitals:   06/16/16 2257 06/17/16 0500  BP: 108/60 117/80  Pulse: 67 68  Resp: 18 18  Temp: 36.7 C 36.7 C    Last Pain:  Vitals:   06/17/16 0900  TempSrc:   PainSc: 3    Pain Goal: Patients Stated Pain Goal: 4 (06/16/16 1005)               Cephus Shelling

## 2016-06-17 NOTE — Discharge Summary (Signed)
Obstetric Discharge Summary Reason for Admission: onset of labor Prenatal Procedures: none Intrapartum Procedures: spontaneous vaginal delivery Postpartum Procedures: none Complications-Operative and Postpartum: none Hemoglobin  Date Value Ref Range Status  06/17/2016 9.9 (L) 12.0 - 15.0 g/dL Final   HCT  Date Value Ref Range Status  06/17/2016 29.5 (L) 36.0 - 46.0 % Final    Physical Exam:  General: alert Lochia: appropriate Uterine Fundus: firm Incision: healing well DVT Evaluation: No evidence of DVT seen on physical exam.  Discharge Diagnoses: Term Pregnancy-delivered  Discharge Information: Date: 06/17/2016 Activity: pelvic rest Diet: routine Medications: PNV, Ibuprofen and Percocet Condition: stable Instructions: refer to practice specific booklet Discharge to: home Follow-up Information    [facility] Follow up.   Contact information: [address] [place] [address] [phone]           Newborn Data: Live born female  Birth Weight: 6 lb 9.6 oz (2995 g) APGAR: 8, 9  Home with mother.  Betty Pratt 06/17/2016, 8:02 AM

## 2017-01-15 LAB — OB RESULTS CONSOLE HEPATITIS B SURFACE ANTIGEN: Hepatitis B Surface Ag: NEGATIVE

## 2017-01-15 LAB — OB RESULTS CONSOLE HIV ANTIBODY (ROUTINE TESTING): HIV: NONREACTIVE

## 2017-01-15 LAB — OB RESULTS CONSOLE ABO/RH: RH Type: POSITIVE

## 2017-01-15 LAB — OB RESULTS CONSOLE RUBELLA ANTIBODY, IGM: Rubella: IMMUNE

## 2017-01-15 LAB — OB RESULTS CONSOLE GC/CHLAMYDIA
Chlamydia: NEGATIVE
GC PROBE AMP, GENITAL: NEGATIVE

## 2017-01-15 LAB — OB RESULTS CONSOLE ANTIBODY SCREEN: ANTIBODY SCREEN: NEGATIVE

## 2017-01-15 LAB — OB RESULTS CONSOLE RPR: RPR: NONREACTIVE

## 2017-02-19 NOTE — L&D Delivery Note (Signed)
Delivery Note At 11:36 AM a viable female was delivered via Vaginal, Spontaneous (Presentation: LOA).  APGAR: 9, 10; weight pending.   Placenta status: S, I. 3V Cord with the following complications: none.  Cord pH: n/a  Anesthesia:  CLEA Episiotomy: None Lacerations: 2nd degree Suture Repair: 3.0 vicryl Est. Blood Loss (mL):  150  Mom to postpartum.  Baby to Couplet care / Skin to Skin.  Betty Pratt, [name] 08/04/2017, 11:55 AM

## 2017-07-30 ENCOUNTER — Telehealth (HOSPITAL_COMMUNITY): Payer: Self-pay | Admitting: *Deleted

## 2017-07-30 ENCOUNTER — Encounter (HOSPITAL_COMMUNITY): Payer: Self-pay | Admitting: *Deleted

## 2017-07-30 LAB — OB RESULTS CONSOLE GBS: STREP GROUP B AG: NEGATIVE

## 2017-07-30 NOTE — Telephone Encounter (Signed)
Preadmission screen  

## 2017-08-04 ENCOUNTER — Other Ambulatory Visit: Payer: Self-pay

## 2017-08-04 ENCOUNTER — Encounter (HOSPITAL_COMMUNITY): Payer: Self-pay | Admitting: *Deleted

## 2017-08-04 ENCOUNTER — Inpatient Hospital Stay (HOSPITAL_COMMUNITY): Payer: BLUE CROSS/BLUE SHIELD | Admitting: Anesthesiology

## 2017-08-04 ENCOUNTER — Inpatient Hospital Stay (HOSPITAL_COMMUNITY)
Admission: AD | Admit: 2017-08-04 | Discharge: 2017-08-05 | DRG: 807 | Disposition: A | Discharge status: Home / Self Care | Payer: BLUE CROSS/BLUE SHIELD | Source: Ambulatory Visit | Attending: Obstetrics & Gynecology | Admitting: Obstetrics & Gynecology

## 2017-08-04 DIAGNOSIS — Z3A38 38 weeks gestation of pregnancy: Secondary | ICD-10-CM | POA: Diagnosis not present

## 2017-08-04 DIAGNOSIS — O9832 Other infections with a predominantly sexual mode of transmission complicating childbirth: Secondary | ICD-10-CM | POA: Diagnosis present

## 2017-08-04 DIAGNOSIS — A6000 Herpesviral infection of urogenital system, unspecified: Secondary | ICD-10-CM | POA: Diagnosis present

## 2017-08-04 DIAGNOSIS — Z3483 Encounter for supervision of other normal pregnancy, third trimester: Secondary | ICD-10-CM | POA: Diagnosis present

## 2017-08-04 DIAGNOSIS — Z3490 Encounter for supervision of normal pregnancy, unspecified, unspecified trimester: Secondary | ICD-10-CM

## 2017-08-04 LAB — CBC
HCT: 32.5 % — ABNORMAL LOW (ref 36.0–46.0)
HEMOGLOBIN: 10.3 g/dL — AB (ref 12.0–15.0)
MCH: 23.2 pg — ABNORMAL LOW (ref 26.0–34.0)
MCHC: 31.7 g/dL (ref 30.0–36.0)
MCV: 73.2 fL — ABNORMAL LOW (ref 78.0–100.0)
PLATELETS: 259 10*3/uL (ref 150–400)
RBC: 4.44 MIL/uL (ref 3.87–5.11)
RDW: 15.5 % (ref 11.5–15.5)
WBC: 10.5 10*3/uL (ref 4.0–10.5)

## 2017-08-04 LAB — TYPE AND SCREEN
ABO/RH(D): A POS
ANTIBODY SCREEN: NEGATIVE

## 2017-08-04 LAB — RPR: RPR: NONREACTIVE

## 2017-08-04 MED ORDER — FLEET ENEMA 7-19 GM/118ML RE ENEM: 1.0000 | ENEMA | RECTAL | Status: DC | PRN

## 2017-08-04 MED ORDER — OXYCODONE-ACETAMINOPHEN 5-325 MG PO TABS: 2.0000 | ORAL_TABLET | ORAL | Status: DC | PRN

## 2017-08-04 MED ORDER — PHENYLEPHRINE 40 MCG/ML (10ML) SYRINGE FOR IV PUSH (FOR BLOOD PRESSURE SUPPORT)
80.0000 ug | PREFILLED_SYRINGE | INTRAVENOUS | Status: DC | PRN
  Filled 2017-08-04: qty 5
  Filled 2017-08-04: qty 10

## 2017-08-04 MED ORDER — PHENYLEPHRINE 40 MCG/ML (10ML) SYRINGE FOR IV PUSH (FOR BLOOD PRESSURE SUPPORT): 80.0000 ug | PREFILLED_SYRINGE | INTRAVENOUS | Status: DC | PRN

## 2017-08-04 MED ORDER — ACETAMINOPHEN 325 MG PO TABS
650.0000 mg | ORAL_TABLET | ORAL | Status: DC | PRN
  Administered 2017-08-04 – 2017-08-05 (×3): 650 mg via ORAL
  Filled 2017-08-04 (×3): qty 2

## 2017-08-04 MED ORDER — SIMETHICONE 80 MG PO CHEW: 80.0000 mg | CHEWABLE_TABLET | ORAL | Status: DC | PRN

## 2017-08-04 MED ORDER — LACTATED RINGERS IV SOLN: 500.0000 mL | Freq: Once | INTRAVENOUS | Status: DC

## 2017-08-04 MED ORDER — DIPHENHYDRAMINE HCL 25 MG PO CAPS: 25.0000 mg | ORAL_CAPSULE | Freq: Four times a day (QID) | ORAL | Status: DC | PRN

## 2017-08-04 MED ORDER — OXYCODONE-ACETAMINOPHEN 5-325 MG PO TABS: 1.0000 | ORAL_TABLET | ORAL | Status: DC | PRN

## 2017-08-04 MED ORDER — LIDOCAINE HCL (PF) 1 % IJ SOLN
INTRAMUSCULAR | Status: DC | PRN
  Administered 2017-08-04 (×2): 5 mL via EPIDURAL

## 2017-08-04 MED ORDER — LIDOCAINE HCL (PF) 1 % IJ SOLN
30.0000 mL | INTRAMUSCULAR | Status: DC | PRN
  Filled 2017-08-04: qty 30

## 2017-08-04 MED ORDER — IBUPROFEN 600 MG PO TABS
600.0000 mg | ORAL_TABLET | Freq: Four times a day (QID) | ORAL | Status: DC
  Administered 2017-08-04 – 2017-08-05 (×5): 600 mg via ORAL
  Filled 2017-08-04 (×5): qty 1

## 2017-08-04 MED ORDER — FENTANYL CITRATE (PF) 100 MCG/2ML IJ SOLN: 50.0000 ug | INTRAMUSCULAR | Status: DC | PRN

## 2017-08-04 MED ORDER — EPHEDRINE 5 MG/ML INJ: 10.0000 mg | INTRAVENOUS | Status: DC | PRN

## 2017-08-04 MED ORDER — ONDANSETRON HCL 4 MG/2ML IJ SOLN: 4.0000 mg | Freq: Four times a day (QID) | INTRAMUSCULAR | Status: DC | PRN

## 2017-08-04 MED ORDER — DIBUCAINE 1 % RE OINT: 1.0000 "application " | TOPICAL_OINTMENT | RECTAL | Status: DC | PRN

## 2017-08-04 MED ORDER — OXYTOCIN BOLUS FROM INFUSION
500.0000 mL | Freq: Once | INTRAVENOUS | Status: AC
  Administered 2017-08-04: 500 mL via INTRAVENOUS

## 2017-08-04 MED ORDER — SOD CITRATE-CITRIC ACID 500-334 MG/5ML PO SOLN: 30.0000 mL | ORAL | Status: DC | PRN

## 2017-08-04 MED ORDER — SENNOSIDES-DOCUSATE SODIUM 8.6-50 MG PO TABS
2.0000 | ORAL_TABLET | ORAL | Status: DC
  Administered 2017-08-04: 2 via ORAL
  Filled 2017-08-04: qty 2

## 2017-08-04 MED ORDER — PHENYLEPHRINE 40 MCG/ML (10ML) SYRINGE FOR IV PUSH (FOR BLOOD PRESSURE SUPPORT)
80.0000 ug | PREFILLED_SYRINGE | INTRAVENOUS | Status: DC | PRN
  Filled 2017-08-04: qty 5

## 2017-08-04 MED ORDER — ACETAMINOPHEN 325 MG PO TABS: 650.0000 mg | ORAL_TABLET | ORAL | Status: DC | PRN

## 2017-08-04 MED ORDER — DIPHENHYDRAMINE HCL 50 MG/ML IJ SOLN: 12.5000 mg | INTRAMUSCULAR | Status: DC | PRN

## 2017-08-04 MED ORDER — LACTATED RINGERS IV SOLN: 500.0000 mL | INTRAVENOUS | Status: DC | PRN

## 2017-08-04 MED ORDER — EPHEDRINE 5 MG/ML INJ
10.0000 mg | INTRAVENOUS | Status: DC | PRN
  Filled 2017-08-04: qty 2

## 2017-08-04 MED ORDER — LACTATED RINGERS IV SOLN
500.0000 mL | Freq: Once | INTRAVENOUS | Status: AC
  Administered 2017-08-04: 500 mL via INTRAVENOUS

## 2017-08-04 MED ORDER — TETANUS-DIPHTH-ACELL PERTUSSIS 5-2.5-18.5 LF-MCG/0.5 IM SUSP: 0.5000 mL | Freq: Once | INTRAMUSCULAR | Status: DC

## 2017-08-04 MED ORDER — ONDANSETRON HCL 4 MG PO TABS: 4.0000 mg | ORAL_TABLET | ORAL | Status: DC | PRN

## 2017-08-04 MED ORDER — ZOLPIDEM TARTRATE 5 MG PO TABS: 5.0000 mg | ORAL_TABLET | Freq: Every evening | ORAL | Status: DC | PRN

## 2017-08-04 MED ORDER — WITCH HAZEL-GLYCERIN EX PADS: 1.0000 "application " | MEDICATED_PAD | CUTANEOUS | Status: DC | PRN

## 2017-08-04 MED ORDER — BENZOCAINE-MENTHOL 20-0.5 % EX AERO
1.0000 "application " | INHALATION_SPRAY | CUTANEOUS | Status: DC | PRN
  Administered 2017-08-04 – 2017-08-05 (×2): 1 via TOPICAL
  Filled 2017-08-04 (×2): qty 56

## 2017-08-04 MED ORDER — LACTATED RINGERS IV SOLN
INTRAVENOUS | Status: DC
  Administered 2017-08-04: 09:00:00 via INTRAVENOUS

## 2017-08-04 MED ORDER — COCONUT OIL OIL: 1.0000 "application " | TOPICAL_OIL | Status: DC | PRN

## 2017-08-04 MED ORDER — OXYTOCIN 40 UNITS IN LACTATED RINGERS INFUSION - SIMPLE MED
2.5000 [IU]/h | INTRAVENOUS | Status: DC
  Administered 2017-08-04: 2.5 [IU]/h via INTRAVENOUS
  Filled 2017-08-04: qty 1000

## 2017-08-04 MED ORDER — FENTANYL 2.5 MCG/ML BUPIVACAINE 1/10 % EPIDURAL INFUSION (WH - ANES)
14.0000 mL/h | INTRAMUSCULAR | Status: DC | PRN
  Administered 2017-08-04: 14 mL/h via EPIDURAL
  Filled 2017-08-04: qty 100

## 2017-08-04 MED ORDER — PRENATAL MULTIVITAMIN CH
1.0000 | ORAL_TABLET | Freq: Every day | ORAL | Status: DC
  Administered 2017-08-05: 1 via ORAL
  Filled 2017-08-04: qty 1

## 2017-08-04 MED ORDER — ONDANSETRON HCL 4 MG/2ML IJ SOLN: 4.0000 mg | INTRAMUSCULAR | Status: DC | PRN

## 2017-08-04 NOTE — Anesthesia Preprocedure Evaluation (Addendum)
Anesthesia Evaluation  Patient identified by MRN, date of birth, ID band Patient awake    Reviewed: Allergy & Precautions, NPO status , Patient's Chart, lab work & pertinent test results  History of Anesthesia Complications Negative for: history of anesthetic complications  Airway Mallampati: II  TM Distance: >3 FB Neck ROM: Full    Dental  (+) Dental Advisory Given   Pulmonary neg pulmonary ROS,    breath sounds clear to auscultation       Cardiovascular negative cardio ROS   Rhythm:Regular Rate:Normal     Neuro/Psych negative neurological ROS     GI/Hepatic negative GI ROS, Neg liver ROS,   Endo/Other  negative endocrine ROS  Renal/GU negative Renal ROS     Musculoskeletal   Abdominal   Peds  Hematology Hb 10.3, plt 259k   Anesthesia Other Findings   Reproductive/Obstetrics (+) Pregnancy                            Anesthesia Physical Anesthesia Plan  ASA: II  Anesthesia Plan: Epidural   Post-op Pain Management:    Induction:   PONV Risk Score and Plan: 2 and Treatment may vary due to age or medical condition  Airway Management Planned: Natural Airway  Additional Equipment:   Intra-op Plan:   Post-operative Plan:   Informed Consent: I have reviewed the patients History and Physical, chart, labs and discussed the procedure including the risks, benefits and alternatives for the proposed anesthesia with the patient or authorized representative who has indicated his/her understanding and acceptance.   Dental advisory given  Plan Discussed with:   Anesthesia Plan Comments: (Patient identified. Risks/Benefits/Options discussed with patient including but not limited to bleeding, infection, nerve damage, paralysis, failed block, incomplete pain control, headache, blood pressure changes, nausea, vomiting, reactions to medication both or allergic, itching and postpartum back pain.  Confirmed with bedside nurse the patient's most recent platelet count. Confirmed with patient that they are not currently taking any anticoagulation, have any bleeding history or any family history of bleeding disorders. Patient expressed understanding and wished to proceed. All questions were answered. )       Anesthesia Quick Evaluation

## 2017-08-04 NOTE — Anesthesia Procedure Notes (Signed)
Epidural Patient location during procedure: OB Start time: 08/04/2017 9:45 AM End time: 08/04/2017 10:12 AM  Staffing Anesthesiologist: Jairo BenJackson, Xzavien Harada, MD Performed: anesthesiologist   Preanesthetic Checklist Completed: patient identified, surgical consent, pre-op evaluation, timeout performed, IV checked, risks and benefits discussed and monitors and equipment checked  Epidural Patient position: sitting Prep: site prepped and draped and DuraPrep Patient monitoring: blood pressure, continuous pulse ox and heart rate Approach: midline Location: L3-L4 Injection technique: LOR air  Needle:  Needle type: Tuohy  Needle gauge: 17 G Needle length: 9 cm Needle insertion depth: 4.5 cm Catheter type: closed end flexible Catheter size: 19 Gauge Catheter at skin depth: 10 cm Test dose: negative (1% lidocaine)  Assessment Events: blood not aspirated, injection not painful, no injection resistance, negative IV test and no paresthesia  Additional Notes Pt identified in Labor room.  Monitors applied. Working IV access confirmed. Sterile prep, drape lumbar spine.  1% lido local L 3,4.  #17ga Touhy LOR air at 4.5 cm L 3,4, cath in easily to 10 cm skin. Test dose OK, cath dosed and infusion begun.  Patient asymptomatic, VSS, no heme aspirated, tolerated well.  Sandford Craze Antanisha Mohs, MDReason for block:procedure for pain

## 2017-08-04 NOTE — Anesthesia Postprocedure Evaluation (Signed)
Anesthesia Post Note  Patient: Betty KyleMegan Knoff  Procedure(s) Performed: AN AD HOC LABOR EPIDURAL     Patient location during evaluation: Mother Baby Anesthesia Type: Epidural Level of consciousness: awake Pain management: satisfactory to patient Vital Signs Assessment: post-procedure vital signs reviewed and stable Respiratory status: spontaneous breathing Cardiovascular status: stable Anesthetic complications: no    Last Vitals:  Vitals:   08/04/17 1500 08/04/17 1740  BP: 138/86 131/73  Pulse: 75 69  Resp: 18 20  Temp: 36.6 C 36.7 C  SpO2:      Last Pain:  Vitals:   08/04/17 1755  TempSrc:   PainSc: 4    Pain Goal:                 KeyCorpBURGER,Sua Spadafora
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
## 2017-08-04 NOTE — MAU Note (Addendum)
Betty KyleMegan Pratt is a 32 y.o. at 4771w6d here in [facility]U reporting: +contractions. Denies LOF. +bloody show. Reports being 4 cm in the office last week. +FM Onset of complaint: this morning around 4am Pain score: 7-8/10. Desires an epidural Vitals:   08/04/17 0724  BP: 133/84  Pulse: 71  Resp: 18  Temp: 98.3 F (36.8 C)  SpO2: 100%     FHT:120 Lab orders placed from triage: none

## 2017-08-05 LAB — CBC
HEMATOCRIT: 30 % — AB (ref 36.0–46.0)
Hemoglobin: 9.6 g/dL — ABNORMAL LOW (ref 12.0–15.0)
MCH: 23.6 pg — AB (ref 26.0–34.0)
MCHC: 32 g/dL (ref 30.0–36.0)
MCV: 73.9 fL — AB (ref 78.0–100.0)
PLATELETS: 214 10*3/uL (ref 150–400)
RBC: 4.06 MIL/uL (ref 3.87–5.11)
RDW: 15.7 % — AB (ref 11.5–15.5)
WBC: 9.6 10*3/uL (ref 4.0–10.5)

## 2017-08-05 NOTE — Discharge Summary (Signed)
Obstetric Discharge Summary Reason for Admission: onset of labor Prenatal Procedures: none Intrapartum Procedures: spontaneous vaginal delivery Postpartum Procedures: none Complications-Operative and Postpartum: 2nd degree perineal laceration Hemoglobin  Date Value Ref Range Status  08/05/2017 9.6 (L) 12.0 - 15.0 g/dL Final   HCT  Date Value Ref Range Status  08/05/2017 30.0 (L) 36.0 - 46.0 % Final    Physical Exam:  General: alert, cooperative and appears stated age 64Lochia: appropriate Uterine Fundus: firm Incision: healing well, no significant drainage, no dehiscence DVT Evaluation: No evidence of DVT seen on physical exam.  Discharge Diagnoses: Term Pregnancy-delivered  Discharge Information: Date: 08/05/2017 Activity: pelvic rest Diet: routine Medications: Ibuprofen Condition: improved Instructions: refer to practice specific booklet Discharge to: home   Newborn Data: Live born female  Birth Weight: 7 lb 9.3 oz (3440 g) APGAR: 9, 10  Newborn Delivery   Birth date/time:  08/04/2017 11:36:00 Delivery type:  Vaginal, Spontaneous     Home with mother.  Tracie Lindbloom L 08/05/2017, 8:00 AM

## 2017-08-07 ENCOUNTER — Inpatient Hospital Stay (HOSPITAL_COMMUNITY): Admission: RE | Admit: 2017-08-07 | Payer: BLUE CROSS/BLUE SHIELD | Source: Ambulatory Visit

## 2019-02-18 ENCOUNTER — Other Ambulatory Visit: Payer: BLUE CROSS/BLUE SHIELD

## 2019-02-18 ENCOUNTER — Ambulatory Visit: Payer: BC Managed Care – PPO | Attending: Internal Medicine

## 2019-02-18 DIAGNOSIS — Z20822 Contact with and (suspected) exposure to covid-19: Secondary | ICD-10-CM

## 2019-02-20 LAB — NOVEL CORONAVIRUS, NAA: SARS-CoV-2, NAA: NOT DETECTED

## 2021-12-07 ENCOUNTER — Other Ambulatory Visit: Payer: Self-pay | Admitting: Obstetrics and Gynecology

## 2021-12-07 DIAGNOSIS — R1900 Intra-abdominal and pelvic swelling, mass and lump, unspecified site: Secondary | ICD-10-CM

## 2021-12-11 ENCOUNTER — Ambulatory Visit
Admission: RE | Admit: 2021-12-11 | Discharge: 2021-12-11 | Disposition: A | Discharge status: Home / Self Care | Payer: BC Managed Care – PPO | Source: Ambulatory Visit | Attending: Obstetrics and Gynecology | Admitting: Obstetrics and Gynecology

## 2021-12-11 DIAGNOSIS — R1900 Intra-abdominal and pelvic swelling, mass and lump, unspecified site: Secondary | ICD-10-CM

## 2023-07-24 ENCOUNTER — Other Ambulatory Visit (HOSPITAL_BASED_OUTPATIENT_CLINIC_OR_DEPARTMENT_OTHER): Payer: Self-pay

## 2023-07-24 MED ORDER — NITROFURANTOIN MONOHYD MACRO 100 MG PO CAPS
100.0000 mg | ORAL_CAPSULE | Freq: Two times a day (BID) | ORAL | 0 refills | Status: AC
  Filled 2023-07-24: qty 20, 10d supply, fill #0
